# Patient Record
Sex: Female | Born: 1995 | Race: White | Hispanic: No | Marital: Single | Smoking: Current every day smoker
Health system: Southern US, Community
[De-identification: ages and names within clinical notes are randomized; demographics above are authoritative.]

## PROBLEM LIST (undated history)

## (undated) HISTORY — PX: CRANIOTOMY: SHX93

---

## 2014-12-25 ENCOUNTER — Emergency Department (HOSPITAL_COMMUNITY)
Admission: EM | Admit: 2014-12-25 | Discharge: 2014-12-25 | Disposition: A | Discharge status: Home / Self Care | Payer: Medicaid Other | Attending: Emergency Medicine | Admitting: Emergency Medicine

## 2014-12-25 ENCOUNTER — Encounter (HOSPITAL_COMMUNITY): Payer: Self-pay | Admitting: Emergency Medicine

## 2014-12-25 DIAGNOSIS — Z720 Tobacco use: Secondary | ICD-10-CM | POA: Insufficient documentation

## 2014-12-25 DIAGNOSIS — H9209 Otalgia, unspecified ear: Secondary | ICD-10-CM | POA: Diagnosis not present

## 2014-12-25 DIAGNOSIS — Z3202 Encounter for pregnancy test, result negative: Secondary | ICD-10-CM | POA: Insufficient documentation

## 2014-12-25 DIAGNOSIS — J039 Acute tonsillitis, unspecified: Secondary | ICD-10-CM | POA: Diagnosis not present

## 2014-12-25 DIAGNOSIS — M791 Myalgia: Secondary | ICD-10-CM | POA: Diagnosis present

## 2014-12-25 DIAGNOSIS — R000 Tachycardia, unspecified: Secondary | ICD-10-CM | POA: Diagnosis not present

## 2014-12-25 DIAGNOSIS — M545 Low back pain: Secondary | ICD-10-CM | POA: Diagnosis not present

## 2014-12-25 LAB — CBC WITH DIFFERENTIAL/PLATELET
Basophils Absolute: 0 10*3/uL (ref 0.0–0.1)
Basophils Relative: 0 % (ref 0–1)
EOS PCT: 1 % (ref 0–5)
Eosinophils Absolute: 0.1 10*3/uL (ref 0.0–0.7)
HEMATOCRIT: 43.9 % (ref 36.0–46.0)
HEMOGLOBIN: 15 g/dL (ref 12.0–15.0)
LYMPHS ABS: 2.5 10*3/uL (ref 0.7–4.0)
LYMPHS PCT: 17 % (ref 12–46)
MCH: 30.4 pg (ref 26.0–34.0)
MCHC: 34.2 g/dL (ref 30.0–36.0)
MCV: 89 fL (ref 78.0–100.0)
MONO ABS: 0.9 10*3/uL (ref 0.1–1.0)
MONOS PCT: 6 % (ref 3–12)
NEUTROS ABS: 11.6 10*3/uL — AB (ref 1.7–7.7)
Neutrophils Relative %: 76 % (ref 43–77)
Platelets: 253 10*3/uL (ref 150–400)
RBC: 4.93 MIL/uL (ref 3.87–5.11)
RDW: 12.5 % (ref 11.5–15.5)
WBC: 15.1 10*3/uL — ABNORMAL HIGH (ref 4.0–10.5)

## 2014-12-25 LAB — URINALYSIS, ROUTINE W REFLEX MICROSCOPIC
BILIRUBIN URINE: NEGATIVE
GLUCOSE, UA: NEGATIVE mg/dL
HGB URINE DIPSTICK: NEGATIVE
Ketones, ur: NEGATIVE mg/dL
Nitrite: NEGATIVE
PROTEIN: NEGATIVE mg/dL
Specific Gravity, Urine: 1.008 (ref 1.005–1.030)
Urobilinogen, UA: 0.2 mg/dL (ref 0.0–1.0)
pH: 7 (ref 5.0–8.0)

## 2014-12-25 LAB — COMPREHENSIVE METABOLIC PANEL
ALBUMIN: 4.3 g/dL (ref 3.5–5.0)
ALT: 18 U/L (ref 14–54)
AST: 17 U/L (ref 15–41)
Alkaline Phosphatase: 78 U/L (ref 38–126)
Anion gap: 11 (ref 5–15)
CO2: 23 mmol/L (ref 22–32)
CREATININE: 0.77 mg/dL (ref 0.44–1.00)
Calcium: 9.3 mg/dL (ref 8.9–10.3)
Chloride: 103 mmol/L (ref 101–111)
GLUCOSE: 91 mg/dL (ref 65–99)
Potassium: 3.4 mmol/L — ABNORMAL LOW (ref 3.5–5.1)
Sodium: 137 mmol/L (ref 135–145)
TOTAL PROTEIN: 7.8 g/dL (ref 6.5–8.1)
Total Bilirubin: 0.7 mg/dL (ref 0.3–1.2)

## 2014-12-25 LAB — URINE MICROSCOPIC-ADD ON

## 2014-12-25 LAB — RAPID STREP SCREEN (MED CTR MEBANE ONLY): Streptococcus, Group A Screen (Direct): NEGATIVE

## 2014-12-25 LAB — POC URINE PREG, ED: Preg Test, Ur: NEGATIVE

## 2014-12-25 MED ORDER — HYDROCODONE-ACETAMINOPHEN 5-325 MG PO TABS
1.0000 | ORAL_TABLET | Freq: Once | ORAL | Status: AC
  Administered 2014-12-25: 1 via ORAL
  Filled 2014-12-25: qty 1

## 2014-12-25 MED ORDER — AMOXICILLIN 500 MG PO CAPS: 500.0000 mg | ORAL_CAPSULE | Freq: Three times a day (TID) | ORAL | Status: DC

## 2014-12-25 MED ORDER — HYDROCODONE-ACETAMINOPHEN 5-325 MG PO TABS: 1.0000 | ORAL_TABLET | Freq: Four times a day (QID) | ORAL | Status: DC | PRN

## 2014-12-25 MED ORDER — IBUPROFEN 400 MG PO TABS
600.0000 mg | ORAL_TABLET | Freq: Once | ORAL | Status: AC
  Administered 2014-12-25: 600 mg via ORAL
  Filled 2014-12-25: qty 2

## 2014-12-25 MED ORDER — PREDNISONE 20 MG PO TABS
60.0000 mg | ORAL_TABLET | Freq: Once | ORAL | Status: AC
  Administered 2014-12-25: 60 mg via ORAL
  Filled 2014-12-25: qty 3

## 2014-12-25 NOTE — Discharge Instructions (Signed)
Your strep screen was negative but we are sending it for culture. We are treating your tonsillitis. Do not drive while taking the pain medication as it will make you sleepy. Return for worsening symptoms.

## 2014-12-25 NOTE — ED Provider Notes (Signed)
CSN: [id number]     Arrival date & time 12/25/14  1953 History   This chart was scribed for non-physician practitioner, [facility] M. Damian Leavell, NP working with Lorre Nick, MD by Doreatha Martin, ED scribe. This patient was seen in room TR03C/TR03C and the patient's care was started at 9:24 PM     Chief Complaint  Patient presents with  . Generalized Body Aches   Patient is a 19 y.o. female presenting with pharyngitis. The history is provided by the patient. No language interpreter was used.  Sore Throat This is a new problem. The current episode started 12 to 24 hours ago. The problem occurs constantly. The problem has been gradually worsening. Associated symptoms include headaches. Nothing aggravates the symptoms. Nothing relieves the symptoms. She has tried nothing for the symptoms. The treatment provided no relief.    HPI Comments: Sandy Chavez is a 19 y.o. female who presents to the Emergency Department complaining of right sided moderate, gradual onset sore throat onset this morning. She states associated fever (Tmax 102), chills, moderate lower back pain, otalgia, nausea, generalized body aches, HA localized to the right side. She states that this feels like previous episodes of strep, but more severe. She denies vomiting.      History reviewed. No pertinent past medical history. History reviewed. No pertinent past surgical history. No family history on file. History  Substance Use Topics  . Smoking status: Current Every Day Smoker  . Smokeless tobacco: Not on file  . Alcohol Use: No   OB History    No data available     Review of Systems  Constitutional: Positive for fever and chills.  HENT: Positive for ear pain and sore throat.   Gastrointestinal: Positive for nausea. Negative for vomiting.  Musculoskeletal: Positive for back pain and arthralgias.  Neurological: Positive for headaches.  All other systems reviewed and are negative.  Allergies  Review of patient's allergies indicates  no known allergies.  Home Medications   Prior to Admission medications   Medication Sig Start Date End Date Taking? Authorizing Provider  acetaminophen (TYLENOL) 500 MG tablet Take 500 mg by mouth every 6 (six) hours as needed for mild pain.   Yes Historical Provider, MD  ibuprofen (ADVIL,MOTRIN) 200 MG tablet Take 200 mg by mouth every 6 (six) hours as needed for mild pain.   Yes Historical Provider, MD  amoxicillin (AMOXIL) 500 MG capsule Take 1 capsule (500 mg total) by mouth 3 (three) times daily. 12/25/14   Westlee Devita Orlene Och, NP  HYDROcodone-acetaminophen (NORCO) 5-325 MG per tablet Take 1 tablet by mouth every 6 (six) hours as needed for moderate pain. 12/25/14   Hollin Crewe Orlene Och, NP   Triage VS: BP 127/92 mmHg  Pulse 113  Temp(Src) 98.8 F (37.1 C) (Oral)  Resp 16  Ht  (1.702 m)  Wt 242 lb (109.77 kg)  BMI 37.89 kg/m2  SpO2 97%  LMP 11/09/2014 (Exact Date) Physical Exam  Constitutional: She is oriented to person, place, and time. She appears well-developed and well-nourished.  HENT:  Head: Normocephalic and atraumatic.  Right Ear: Tympanic membrane normal.  Left Ear: Tympanic membrane normal.  Nose: Nose normal.  Mouth/Throat: Uvula is midline. Oropharyngeal exudate and posterior oropharyngeal erythema present. No tonsillar abscesses.  Tonsils are enlarged bilaterally with exudate.   Eyes: Conjunctivae and EOM are normal. Pupils are equal, round, and reactive to light.  Neck: Normal range of motion. Neck supple.  Cardiovascular: Regular rhythm.  Tachycardia present.   Pulmonary/Chest: Effort  normal. No respiratory distress. She has no wheezes. She has no rales.  Abdominal: Soft. Bowel sounds are normal. There is no tenderness.  Genitourinary:  No CVA tenderness.  Musculoskeletal: Normal range of motion.  Lymphadenopathy:    She has cervical adenopathy.  Neurological: She is alert and oriented to person, place, and time.  Skin: Skin is warm and dry.  Psychiatric: She has a  normal mood and affect. Her behavior is normal.  Nursing note and vitals reviewed.  ED Course  Procedures (including critical care time) DIAGNOSTIC STUDIES: Oxygen Saturation is 97% on RA, normal by my interpretation.    COORDINATION OF CARE: 9:30 PM Discussed treatment plan with pt at bedside and pt agreed to plan.   Labs Review Results for orders placed or performed during the hospital encounter of 12/25/14 (from the past 24 hour(s))  CBC with Differential     Status: Abnormal   Collection Time: 12/25/14  8:23 PM  Result Value Ref Range   WBC 15.1 (H) 4.0 - 10.5 K/uL   RBC 4.93 3.87 - 5.11 MIL/uL   Hemoglobin 15.0 12.0 - 15.0 g/dL   HCT 25.4 98.2 - 64.1 %   MCV 89.0 78.0 - 100.0 fL   MCH 30.4 26.0 - 34.0 pg   MCHC 34.2 30.0 - 36.0 g/dL   RDW 58.3 09.4 - 07.6 %   Platelets 253 150 - 400 K/uL   Neutrophils Relative % 76 43 - 77 %   Neutro Abs 11.6 (H) 1.7 - 7.7 K/uL   Lymphocytes Relative 17 12 - 46 %   Lymphs Abs 2.5 0.7 - 4.0 K/uL   Monocytes Relative 6 3 - 12 %   Monocytes Absolute 0.9 0.1 - 1.0 K/uL   Eosinophils Relative 1 0 - 5 %   Eosinophils Absolute 0.1 0.0 - 0.7 K/uL   Basophils Relative 0 0 - 1 %   Basophils Absolute 0.0 0.0 - 0.1 K/uL  Comprehensive metabolic panel     Status: Abnormal   Collection Time: 12/25/14  8:23 PM  Result Value Ref Range   Sodium 137 135 - 145 mmol/L   Potassium 3.4 (L) 3.5 - 5.1 mmol/L   Chloride 103 101 - 111 mmol/L   CO2 23 22 - 32 mmol/L   Glucose, Bld 91 65 - 99 mg/dL   BUN <5 (L) 6 - 20 mg/dL   Creatinine, Ser 8.08 0.44 - 1.00 mg/dL   Calcium 9.3 8.9 - 81.1 mg/dL   Total Protein 7.8 6.5 - 8.1 g/dL   Albumin 4.3 3.5 - 5.0 g/dL   AST 17 15 - 41 U/L   ALT 18 14 - 54 U/L   Alkaline Phosphatase 78 38 - 126 U/L   Total Bilirubin 0.7 0.3 - 1.2 mg/dL   GFR calc non Af Amer >60 >60 mL/min   GFR calc Af Amer >60 >60 mL/min   Anion gap 11 5 - 15  POC Urine Pregnancy, ED (do NOT order at [facility])     Status: None   Collection Time:  12/25/14  8:38 PM  Result Value Ref Range   Preg Test, Ur NEGATIVE NEGATIVE  Rapid strep screen (not at [facility])     Status: None   Collection Time: 12/25/14  9:34 PM  Result Value Ref Range   Streptococcus, Group A Screen (Direct) NEGATIVE NEGATIVE   Prednisone 60 mg PO, hydrocodone, ibuprofen   MDM  19 y.o. female with sore throat and fever that started today. Stable for d/c  and does not appear toxic. Discussed with the patient clinical and lab findings and plan of care. All questioned fully answered. She will return if any problems arise. Will treat for tonsillitis.  Final diagnoses:  Tonsillitis with exudate   I personally performed the services described in this documentation, which was scribed in my presence. The recorded information has been reviewed and is accurate.   [place], [place] 12/25/14 2229  Lorre Nick, MD 12/28/14 [phone]

## 2014-12-25 NOTE — ED Notes (Signed)
Pt. reports generalized body aches , chills, low grade fever , occasional productive cough , nasal and chest congestion .

## 2014-12-29 LAB — CULTURE, GROUP A STREP

## 2015-08-03 ENCOUNTER — Emergency Department (HOSPITAL_COMMUNITY)
Admission: EM | Admit: 2015-08-03 | Discharge: 2015-08-04 | Disposition: A | Discharge status: Home / Self Care | Payer: Medicaid Other | Attending: Emergency Medicine | Admitting: Emergency Medicine

## 2015-08-03 ENCOUNTER — Encounter (HOSPITAL_COMMUNITY): Payer: Self-pay | Admitting: *Deleted

## 2015-08-03 DIAGNOSIS — R110 Nausea: Secondary | ICD-10-CM | POA: Insufficient documentation

## 2015-08-03 DIAGNOSIS — O99331 Smoking (tobacco) complicating pregnancy, first trimester: Secondary | ICD-10-CM | POA: Insufficient documentation

## 2015-08-03 DIAGNOSIS — O26899 Other specified pregnancy related conditions, unspecified trimester: Secondary | ICD-10-CM

## 2015-08-03 DIAGNOSIS — O418X10 Other specified disorders of amniotic fluid and membranes, first trimester, not applicable or unspecified: Secondary | ICD-10-CM

## 2015-08-03 DIAGNOSIS — Z3491 Encounter for supervision of normal pregnancy, unspecified, first trimester: Secondary | ICD-10-CM

## 2015-08-03 DIAGNOSIS — Z3490 Encounter for supervision of normal pregnancy, unspecified, unspecified trimester: Secondary | ICD-10-CM

## 2015-08-03 DIAGNOSIS — F17200 Nicotine dependence, unspecified, uncomplicated: Secondary | ICD-10-CM | POA: Insufficient documentation

## 2015-08-03 DIAGNOSIS — Z3A01 Less than 8 weeks gestation of pregnancy: Secondary | ICD-10-CM | POA: Insufficient documentation

## 2015-08-03 DIAGNOSIS — O468X1 Other antepartum hemorrhage, first trimester: Secondary | ICD-10-CM

## 2015-08-03 DIAGNOSIS — Z792 Long term (current) use of antibiotics: Secondary | ICD-10-CM | POA: Insufficient documentation

## 2015-08-03 DIAGNOSIS — R109 Unspecified abdominal pain: Secondary | ICD-10-CM

## 2015-08-03 LAB — CBC
HCT: 38.8 % (ref 36.0–46.0)
Hemoglobin: 13.5 g/dL (ref 12.0–15.0)
MCH: 31.3 pg (ref 26.0–34.0)
MCHC: 34.8 g/dL (ref 30.0–36.0)
MCV: 89.8 fL (ref 78.0–100.0)
Platelets: 207 10*3/uL (ref 150–400)
RBC: 4.32 MIL/uL (ref 3.87–5.11)
RDW: 12.6 % (ref 11.5–15.5)
WBC: 12.3 10*3/uL — ABNORMAL HIGH (ref 4.0–10.5)

## 2015-08-03 LAB — URINALYSIS, ROUTINE W REFLEX MICROSCOPIC
Bilirubin Urine: NEGATIVE
GLUCOSE, UA: NEGATIVE mg/dL
Ketones, ur: NEGATIVE mg/dL
Nitrite: NEGATIVE
Protein, ur: NEGATIVE mg/dL
Specific Gravity, Urine: 1.026 (ref 1.005–1.030)
pH: 5.5 (ref 5.0–8.0)

## 2015-08-03 LAB — URINE MICROSCOPIC-ADD ON

## 2015-08-03 LAB — COMPREHENSIVE METABOLIC PANEL
ALT: 22 U/L (ref 14–54)
AST: 17 U/L (ref 15–41)
Albumin: 3.5 g/dL (ref 3.5–5.0)
Alkaline Phosphatase: 43 U/L (ref 38–126)
Anion gap: 10 (ref 5–15)
BUN: 8 mg/dL (ref 6–20)
CHLORIDE: 103 mmol/L (ref 101–111)
CO2: 22 mmol/L (ref 22–32)
Calcium: 9.1 mg/dL (ref 8.9–10.3)
Creatinine, Ser: 0.78 mg/dL (ref 0.44–1.00)
GFR calc non Af Amer: >60 mL/min (ref >60)
Glucose, Bld: 96 mg/dL (ref 65–99)
Potassium: 3.5 mmol/L (ref 3.5–5.1)
SODIUM: 135 mmol/L (ref 135–145)
Total Bilirubin: 0.1 mg/dL — ABNORMAL LOW (ref 0.3–1.2)
Total Protein: 6.4 g/dL — ABNORMAL LOW (ref 6.5–8.1)

## 2015-08-03 LAB — LIPASE, BLOOD: LIPASE: 28 U/L (ref 11–51)

## 2015-08-03 LAB — POC URINE PREG, ED: Preg Test, Ur: POSITIVE — AB

## 2015-08-03 NOTE — ED Notes (Signed)
abd pain n v  With diarrhea and constipation for 2-3 days with a headache.  lmp last month

## 2015-08-04 ENCOUNTER — Emergency Department (HOSPITAL_COMMUNITY): Payer: Medicaid Other

## 2015-08-04 LAB — WET PREP, GENITAL
SPERM: NONE SEEN
TRICH WET PREP: NONE SEEN
Yeast Wet Prep HPF POC: NONE SEEN

## 2015-08-04 LAB — HCG, QUANTITATIVE, PREGNANCY: hCG, Beta Chain, Quant, S: 53230 m[IU]/mL — ABNORMAL HIGH (ref <5)

## 2015-08-04 MED ORDER — PRENATAL COMPLETE 14-0.4 MG PO TABS: ORAL_TABLET | ORAL | Status: DC

## 2015-08-04 NOTE — ED Provider Notes (Signed)
G1P0 with RLQ abd pain, nausea Non-toxic in appearance, appears comfortable No fever Pending [place] WBC 12.3 If [place] normal with visualized IUP, send home with strict return precautions for RLQ/appy symptoms; if [place] has no IUP, [facility]/u [facility] 48 hours for repeat quant AND home with strict return precautions for RLQ/appy symptoms.   2:00 - The [place] shows an IUP of 6w, small to moderate subchorionic hemorrhage. No vaginal bleeding. Re-evaluation of the patient: RLQ non-tender. She is well appearing. Discussed symptoms of appendicitis and low risk of current condition being related to inflammed appy. Discussed return precautions. Will refer to OB/GYN for prenatal care. Prenatal vitamins prescribed.   Elpidio Anis, PA-C 08/04/15 1610  Gilda Crease, MD 08/04/15 Jeralyn Bennett

## 2015-08-04 NOTE — Discharge Instructions (Signed)
First Trimester of Pregnancy The first trimester of pregnancy is from week 1 until the end of week 12 (months 1 through 3). A week after a sperm fertilizes an egg, the egg will implant on the wall of the uterus. This embryo will begin to develop into a baby. Genes from you and your partner are forming the baby. The female genes determine whether the baby is a boy or a girl. At 6-8 weeks, the eyes and face are formed, and the heartbeat can be seen on ultrasound. At the end of 12 weeks, all the baby's organs are formed.  Now that you are pregnant, you will want to do everything you can to have a healthy baby. Two of the most important things are to get good prenatal care and to follow your health care provider's instructions. Prenatal care is all the medical care you receive before the baby's birth. This care will help prevent, find, and treat any problems during the pregnancy and childbirth. BODY CHANGES Your body goes through many changes during pregnancy. The changes vary from woman to woman.   You may gain or lose a couple of pounds at first.  You may feel sick to your stomach (nauseous) and throw up (vomit). If the vomiting is uncontrollable, call your health care provider.  You may tire easily.  You may develop headaches that can be relieved by medicines approved by your health care provider.  You may urinate more often. Painful urination may mean you have a bladder infection.  You may develop heartburn as a result of your pregnancy.  You may develop constipation because certain hormones are causing the muscles that push waste through your intestines to slow down.  You may develop hemorrhoids or swollen, bulging veins (varicose veins).  Your breasts may begin to grow larger and become tender. Your nipples may stick out more, and the tissue that surrounds them (areola) may become darker.  Your gums may bleed and may be sensitive to brushing and flossing.  Dark spots or blotches (chloasma,  mask of pregnancy) may develop on your face. This will likely fade after the baby is born.  Your menstrual periods will stop.  You may have a loss of appetite.  You may develop cravings for certain kinds of food.  You may have changes in your emotions from day to day, such as being excited to be pregnant or being concerned that something may go wrong with the pregnancy and baby.  You may have more vivid and strange dreams.  You may have changes in your hair. These can include thickening of your hair, rapid growth, and changes in texture. Some women also have hair loss during or after pregnancy, or hair that feels dry or thin. Your hair will most likely return to normal after your baby is born. WHAT TO EXPECT AT YOUR PRENATAL VISITS During a routine prenatal visit:  You will be weighed to make sure you and the baby are growing normally.  Your blood pressure will be taken.  Your abdomen will be measured to track your baby's growth.  The fetal heartbeat will be listened to starting around week 10 or 12 of your pregnancy.  Test results from any previous visits will be discussed. Your health care provider may ask you:  How you are feeling.  If you are feeling the baby move.  If you have had any abnormal symptoms, such as leaking fluid, bleeding, severe headaches, or abdominal cramping.  If you are using any tobacco products,   including cigarettes, chewing tobacco, and electronic cigarettes.  If you have any questions. Other tests that may be performed during your first trimester include:  Blood tests to find your blood type and to check for the presence of any previous infections. They will also be used to check for low iron levels (anemia) and Rh antibodies. Later in the pregnancy, blood tests for diabetes will be done along with other tests if problems develop.  Urine tests to check for infections, diabetes, or protein in the urine.  An ultrasound to confirm the proper growth  and development of the baby.  An amniocentesis to check for possible genetic problems.  Fetal screens for spina bifida and Down syndrome.  You may need other tests to make sure you and the baby are doing well.  HIV (human immunodeficiency virus) testing. Routine prenatal testing includes screening for HIV, unless you choose not to have this test. HOME CARE INSTRUCTIONS  Medicines  Follow your health care provider's instructions regarding medicine use. Specific medicines may be either safe or unsafe to take during pregnancy.  Take your prenatal vitamins as directed.  If you develop constipation, try taking a stool softener if your health care provider approves. Diet  Eat regular, well-balanced meals. Choose a variety of foods, such as meat or vegetable-based protein, fish, milk and low-fat dairy products, vegetables, fruits, and whole grain breads and cereals. Your health care provider will help you determine the amount of weight gain that is right for you.  Avoid raw meat and uncooked cheese. These carry germs that can cause birth defects in the baby.  Eating four or five small meals rather than three large meals a day may help relieve nausea and vomiting. If you start to feel nauseous, eating a few soda crackers can be helpful. Drinking liquids between meals instead of during meals also seems to help nausea and vomiting.  If you develop constipation, eat more high-fiber foods, such as fresh vegetables or fruit and whole grains. Drink enough fluids to keep your urine clear or pale yellow. Activity and Exercise  Exercise only as directed by your health care provider. Exercising will help you:  Control your weight.  Stay in shape.  Be prepared for labor and delivery.  Experiencing pain or cramping in the lower abdomen or low back is a good sign that you should stop exercising. Check with your health care provider before continuing normal exercises.  Try to avoid standing for long  periods of time. Move your legs often if you must stand in one place for a long time.  Avoid heavy lifting.  Wear low-heeled shoes, and practice good posture.  You may continue to have sex unless your health care provider directs you otherwise. Relief of Pain or Discomfort  Wear a good support bra for breast tenderness.   Take warm sitz baths to soothe any pain or discomfort caused by hemorrhoids. Use hemorrhoid cream if your health care provider approves.   Rest with your legs elevated if you have leg cramps or low back pain.  If you develop varicose veins in your legs, wear support hose. Elevate your feet for 15 minutes, 3-4 times a day. Limit salt in your diet. Prenatal Care  Schedule your prenatal visits by the twelfth week of pregnancy. They are usually scheduled monthly at first, then more often in the last 2 months before delivery.  Write down your questions. Take them to your prenatal visits.  Keep all your prenatal visits as directed by your   health care provider. Safety  Wear your seat belt at all times when driving.  Make a list of emergency phone numbers, including numbers for family, friends, the hospital, and police and fire departments. General Tips  Ask your health care provider for a referral to a local prenatal education class. Begin classes no later than at the beginning of month 6 of your pregnancy.  Ask for help if you have counseling or nutritional needs during pregnancy. Your health care provider can offer advice or refer you to specialists for help with various needs.  Do not use hot tubs, steam rooms, or saunas.  Do not douche or use tampons or scented sanitary pads.  Do not cross your legs for long periods of time.  Avoid cat litter boxes and soil used by cats. These carry germs that can cause birth defects in the baby and possibly loss of the fetus by miscarriage or stillbirth.  Avoid all smoking, herbs, alcohol, and medicines not prescribed by  your health care provider. Chemicals in these affect the formation and growth of the baby.  Do not use any tobacco products, including cigarettes, chewing tobacco, and electronic cigarettes. If you need help quitting, ask your health care provider. You may receive counseling support and other resources to help you quit.  Schedule a dentist appointment. At home, brush your teeth with a soft toothbrush and be gentle when you floss. SEEK MEDICAL CARE IF:   You have dizziness.  You have mild pelvic cramps, pelvic pressure, or nagging pain in the abdominal area.  You have persistent nausea, vomiting, or diarrhea.  You have a bad smelling vaginal discharge.  You have pain with urination.  You notice increased swelling in your face, hands, legs, or ankles. SEEK IMMEDIATE MEDICAL CARE IF:   You have a fever.  You are leaking fluid from your vagina.  You have spotting or bleeding from your vagina.  You have severe abdominal cramping or pain.  You have rapid weight gain or loss.  You vomit blood or material that looks like coffee grounds.  You are exposed to German measles and have never had them.  You are exposed to fifth disease or chickenpox.  You develop a severe headache.  You have shortness of breath.  You have any kind of trauma, such as from a fall or a car accident.   This information is not intended to replace advice given to you by your health care provider. Make sure you discuss any questions you have with your health care provider.   Document Released: 06/17/2001 Document Revised: 07/14/2014 Document Reviewed: 05/03/2013 Elsevier Interactive Patient Education 2016 [employer]  

## 2015-08-04 NOTE — ED Provider Notes (Signed)
CSN: [id number]     Arrival date & time 08/03/15  2225 History   First MD Initiated Contact with Patient 08/04/15 0001     Chief Complaint  Patient presents with  . Abdominal Pain     (Consider location/radiation/quality/duration/timing/severity/associated sxs/prior Treatment) Patient is a 20 y.o. female presenting with abdominal pain. The history is provided by the patient. No language interpreter was used.  Abdominal Pain Pain location:  RLQ Pain quality: aching   Pain radiates to:  Does not radiate Pain severity:  Moderate Onset quality:  Gradual Duration:  2 days Timing:  Constant Progression:  Unchanged Chronicity:  New Context: not sick contacts   Relieved by:  Nothing Worsened by:  Nothing tried Ineffective treatments:  None tried Associated symptoms: nausea     History reviewed. No pertinent past medical history. History reviewed. No pertinent past surgical history. No family history on file. Social History  Substance Use Topics  . Smoking status: Current Every Day Smoker  . Smokeless tobacco: None  . Alcohol Use: No   OB History    No data available     Review of Systems  Gastrointestinal: Positive for nausea and abdominal pain.  All other systems reviewed and are negative.     Allergies  Review of patient's allergies indicates no known allergies.  Home Medications   Prior to Admission medications   Medication Sig Start Date End Date Taking? Authorizing Provider  acetaminophen (TYLENOL) 500 MG tablet Take 500 mg by mouth every 6 (six) hours as needed for mild pain.    Historical Provider, MD  amoxicillin (AMOXIL) 500 MG capsule Take 1 capsule (500 mg total) by mouth 3 (three) times daily. 12/25/14   Hope Orlene Och, NP  HYDROcodone-acetaminophen (NORCO) 5-325 MG per tablet Take 1 tablet by mouth every 6 (six) hours as needed for moderate pain. 12/25/14   Hope Orlene Och, NP  ibuprofen (ADVIL,MOTRIN) 200 MG tablet Take 200 mg by mouth every 6 (six) hours as  needed for mild pain.    Historical Provider, MD   BP 115/83 mmHg  Pulse 87  Temp(Src) 98.2 F (36.8 C)  Resp 16  Ht  (1.702 m)  Wt 112.549 kg  BMI 38.85 kg/m2  SpO2 100%  LMP 07/03/2015 Physical Exam  Constitutional: She is oriented to person, place, and time. She appears well-developed and well-nourished.  HENT:  Head: Normocephalic.  Eyes: EOM are normal. Pupils are equal, round, and reactive to light.  Neck: Normal range of motion.  Cardiovascular: Normal rate and normal heart sounds.   Pulmonary/Chest: Effort normal.  Abdominal: Soft. She exhibits no distension. There is tenderness.  Genitourinary: Uterus normal. Vaginal discharge found.  Musculoskeletal: Normal range of motion.  Neurological: She is alert and oriented to person, place, and time.  Skin: Skin is warm.  Psychiatric: She has a normal mood and affect.  Nursing note and vitals reviewed.   ED Course  Procedures (including critical care time) Labs Review Labs Reviewed  COMPREHENSIVE METABOLIC PANEL - Abnormal; Notable for the following:    Total Protein 6.4 (*)    Total Bilirubin 0.1 (*)    All other components within normal limits  CBC - Abnormal; Notable for the following:    WBC 12.3 (*)    All other components within normal limits  URINALYSIS, ROUTINE W REFLEX MICROSCOPIC (NOT AT [facility]) - Abnormal; Notable for the following:    APPearance CLOUDY (*)    Hgb urine dipstick SMALL (*)    Leukocytes,  UA SMALL (*)    All other components within normal limits  URINE MICROSCOPIC-ADD ON - Abnormal; Notable for the following:    Squamous Epithelial / LPF 6-30 (*)    Bacteria, UA FEW (*)    All other components within normal limits  POC URINE PREG, ED - Abnormal; Notable for the following:    Preg Test, Ur POSITIVE (*)    All other components within normal limits  WET PREP, GENITAL  LIPASE, BLOOD  HCG, QUANTITATIVE, PREGNANCY  GC/CHLAMYDIA PROBE AMP ([place]) NOT AT [facility]    Imaging Review No  results found. I have personally reviewed and evaluated these images and lab results as part of my medical decision-making.   EKG Interpretation None      MDM pt advised of positive pregnancy test,     Final diagnoses:  None   Pt's care turned over to Dr. Oletta Cohn and Elpidio Anis PA at 1:00am.  Ultrasound pending.     [place], PA-C 08/04/15 0036  Gilda Crease, MD 08/04/15 [phone]

## 2015-08-04 NOTE — ED Notes (Signed)
Pt departed in NAD.  

## 2015-08-06 LAB — GC/CHLAMYDIA PROBE AMP (~~LOC~~) NOT AT ARMC
CHLAMYDIA, DNA PROBE: NEGATIVE
Neisseria Gonorrhea: NEGATIVE

## 2015-08-10 ENCOUNTER — Telehealth: Payer: Self-pay | Admitting: *Deleted

## 2015-08-10 NOTE — Telephone Encounter (Signed)
Pharmacy called related to Rx: Prenatal Vit-Fe Fumarate-FA (PRENATAL COMPLETE) 14-0.4 MG TABS  .Marland KitchenMarland KitchenEDCM clarified with EDP to change Rx to: PND vitamin which is more affordable for patient.

## 2017-06-12 IMAGING — US US OB TRANSVAGINAL
1 series · 13 of 28 positions shown · non-contrast
Comparison: None.

CLINICAL DATA: Acute onset of right lower quadrant abdominal pain.
Initial encounter.

EXAM:
OBSTETRIC <14 WK US AND TRANSVAGINAL OB US
TECHNIQUE: Both transabdominal and transvaginal ultrasound examinations were
performed for complete evaluation of the gestation as well as the
maternal uterus, adnexal regions, and pelvic cul-de-sac.
Transvaginal technique was performed to assess early pregnancy.

[Series 1: us ob transvaginal · 0.23mm/px · 13 of 54 slices shown]
[im 2/54]
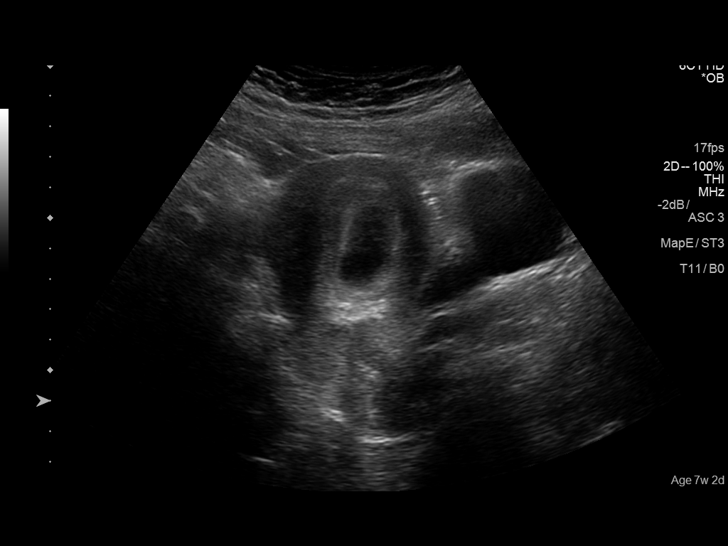
[im 6/54]
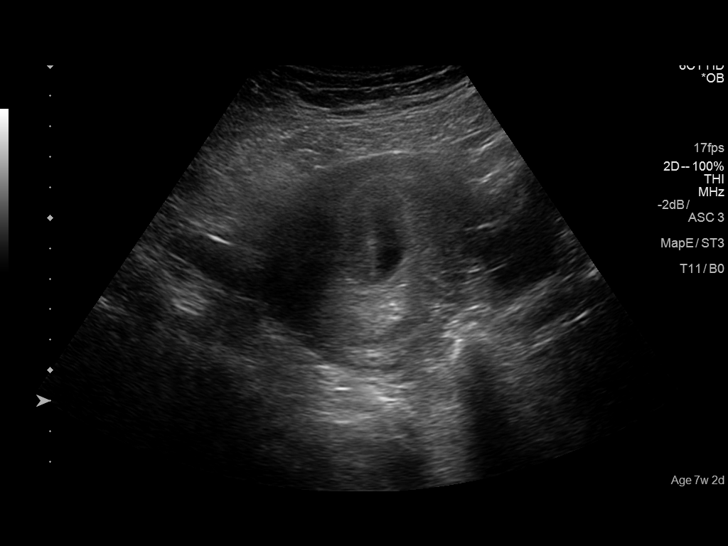
[im 10/54]
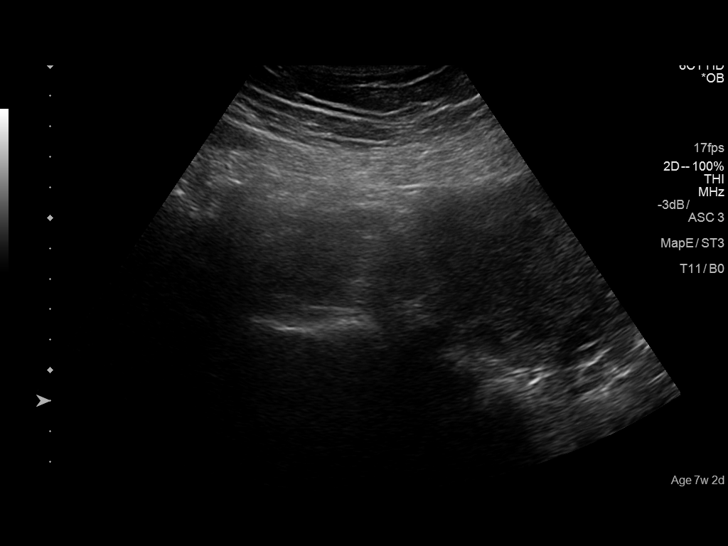
[im 14/54]
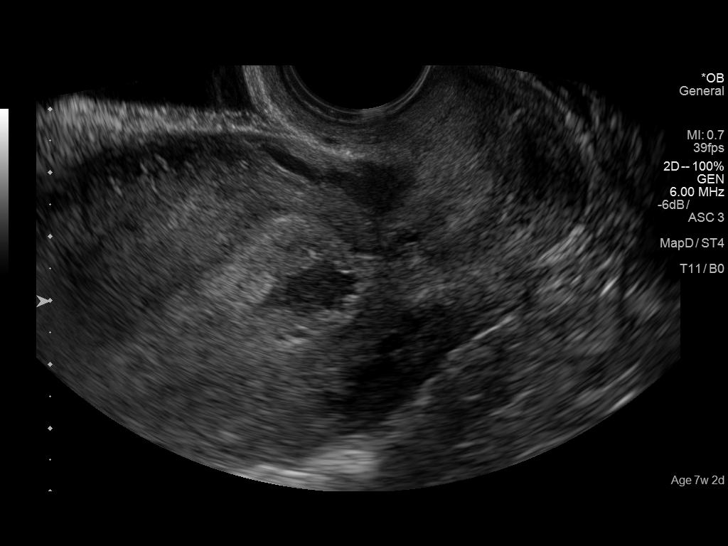
[im 18/54]
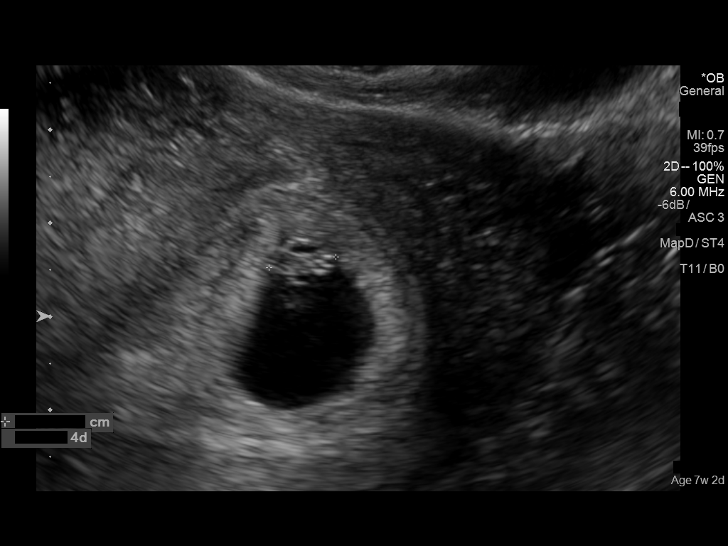
[im 22/54]
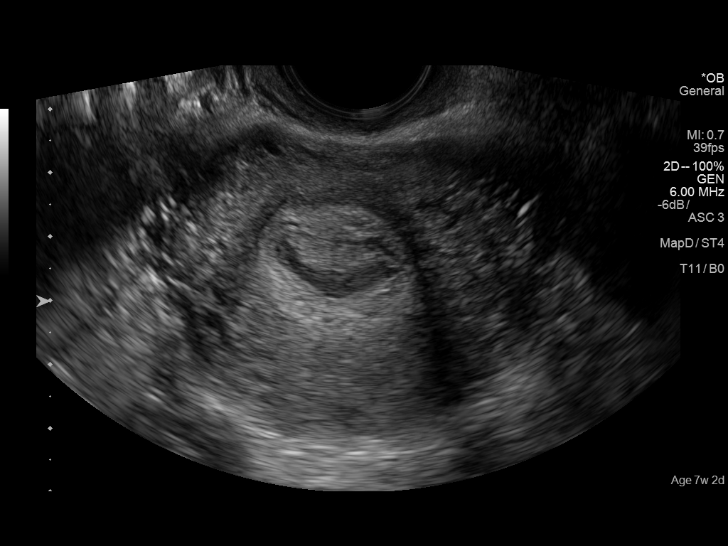
[im 28/54]
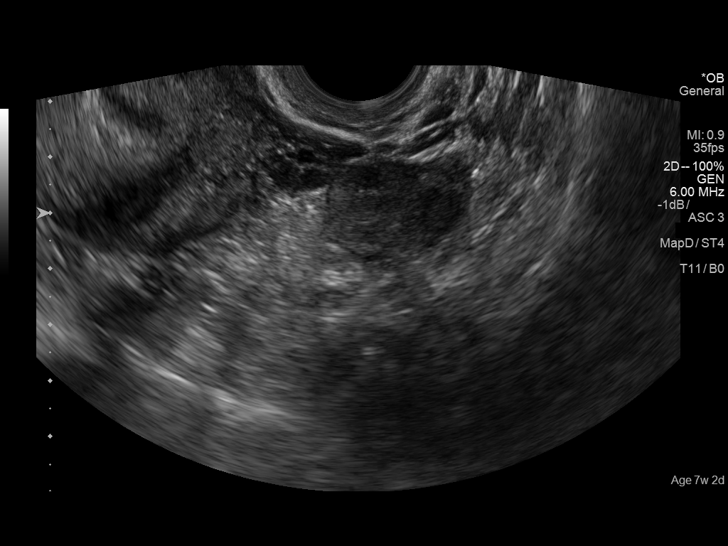
[im 32/54]
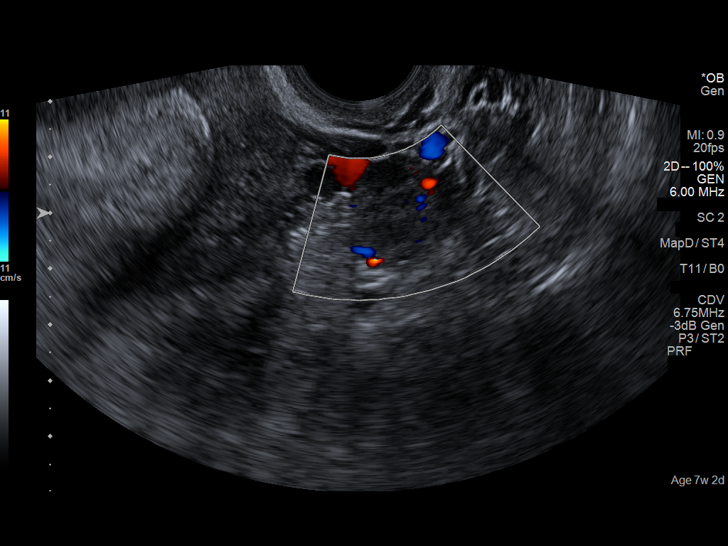
[im 36/54]
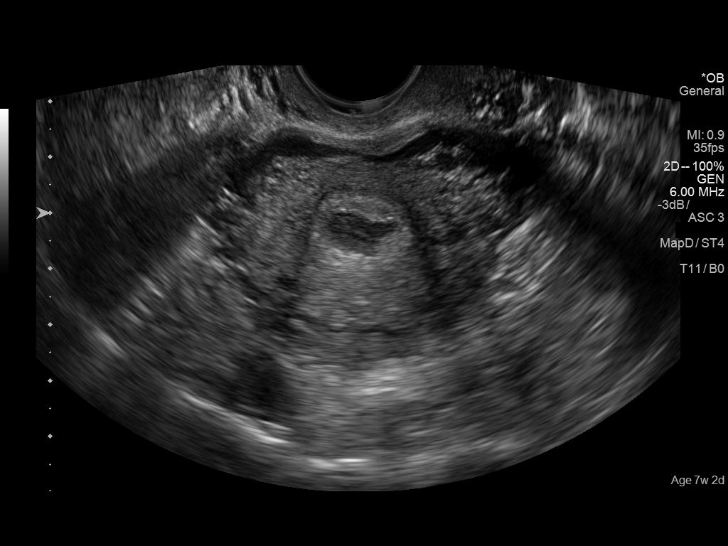
[im 40/54]
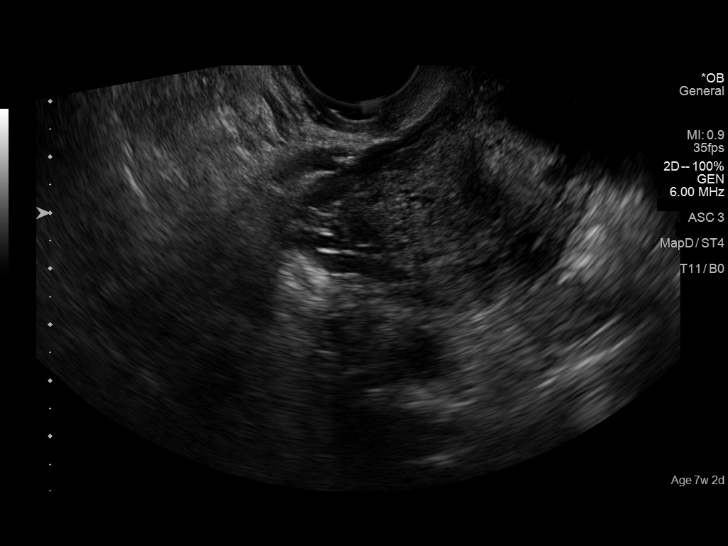
[im 44/54]
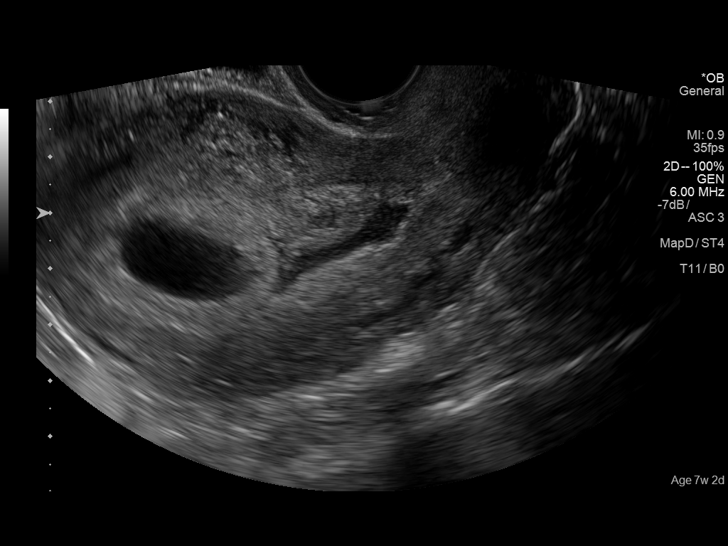
[im 48/54]
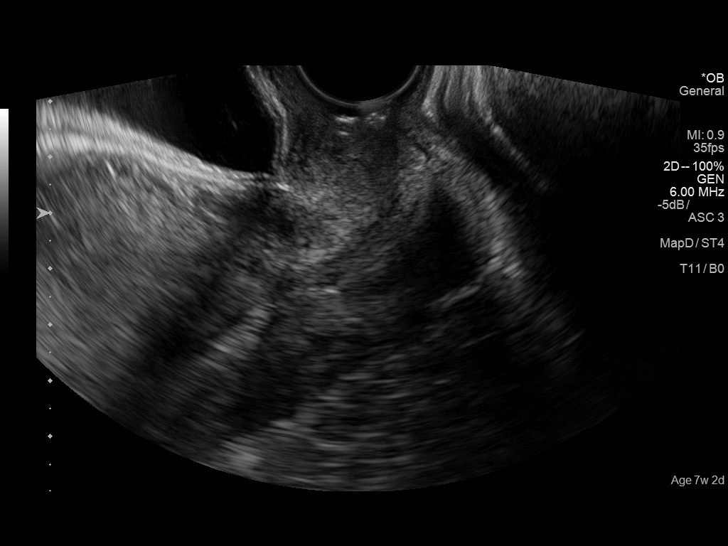
[im 52/54]
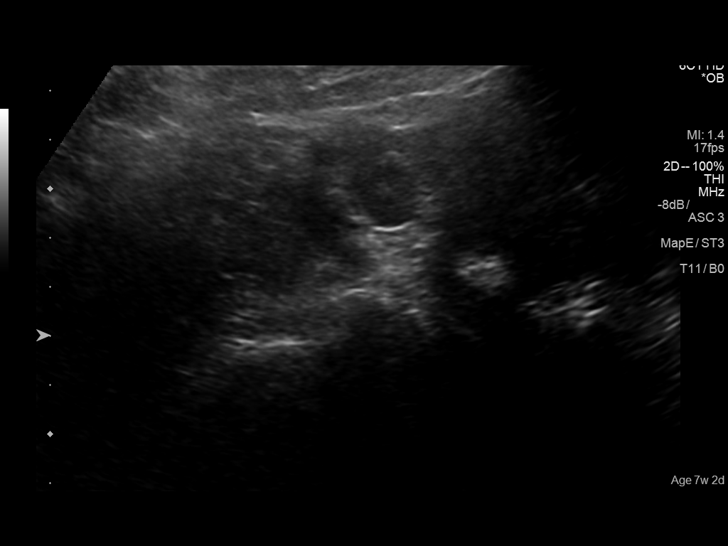

[13 of 28 positions shown; findings below may reference images not displayed]

FINDINGS: Intrauterine gestational sac: Visualized/normal in shape.

Yolk sac:  Yes

Embryo:  Yes

Cardiac Activity: Yes

Heart Rate: 117  bpm

CRL:  7.4  mm   6 w   4 d                  US EDC: 03/25/2016

Subchorionic hemorrhage: A small to moderate amount of subchorionic
hemorrhage is noted.

Maternal uterus/adnexae: The uterus is otherwise unremarkable in
appearance.

The ovaries are unremarkable in appearance. The right ovary measures
2.6 x 1.8 x 2.5 cm, while the left ovary measures 3.2 x 2.2 x
cm. No suspicious adnexal masses are seen; there is no evidence for
ovarian torsion.

No free fluid is seen within the pelvic cul-de-sac.
IMPRESSION: 1. Single live intrauterine pregnancy noted, with a crown-rump
length of 7 mm, corresponding to a gestational age of 6 weeks 4
days. This matches the gestational age of 7 weeks 2 days by LMP,
reflecting an estimated date of delivery March 20, 2016.
2. Small to moderate amount of subchorionic hemorrhage noted.

## 2019-08-27 DIAGNOSIS — W3400XA Accidental discharge from unspecified firearms or gun, initial encounter: Secondary | ICD-10-CM

## 2019-08-27 HISTORY — DX: Accidental discharge from unspecified firearms or gun, initial encounter: W34.00XA

## 2019-12-15 ENCOUNTER — Ambulatory Visit: Payer: Medicaid Other | Admitting: Neurology

## 2019-12-15 ENCOUNTER — Other Ambulatory Visit: Payer: Self-pay

## 2019-12-15 ENCOUNTER — Encounter: Payer: Self-pay | Admitting: Neurology

## 2019-12-15 ENCOUNTER — Telehealth: Payer: Self-pay | Admitting: Neurology

## 2019-12-15 VITALS — BP 140/91 | HR 79 | Ht 67.0 in | Wt 256.0 lb

## 2019-12-15 DIAGNOSIS — S069X5S Unspecified intracranial injury with loss of consciousness greater than 24 hours with return to pre-existing conscious level, sequela: Secondary | ICD-10-CM

## 2019-12-15 MED ORDER — GABAPENTIN 100 MG PO CAPS: 100.0000 mg | ORAL_CAPSULE | Freq: Three times a day (TID) | ORAL | 3 refills | Status: AC
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
## 2019-12-15 NOTE — Telephone Encounter (Signed)
Medicaid order sent to GI. They will obtain the auth and reach out to the patient to schedule.  

## 2020-02-08 NOTE — Telephone Encounter (Signed)
mcd complete Berkley Harvey: [id number] (exp. 01/05/20 to 06/13/20) order sent to [facility] to be r/s.

## 2020-02-27 ENCOUNTER — Other Ambulatory Visit: Payer: Self-pay

## 2020-04-16 ENCOUNTER — Encounter: Payer: Self-pay | Admitting: Neurology

## 2020-04-16 ENCOUNTER — Ambulatory Visit: Payer: Medicaid Other | Admitting: Neurology
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
